# Patient Record
Sex: Male | Born: 1960 | Race: White | Marital: Married
Health system: Western US, Academic
[De-identification: ages and names within clinical notes are randomized; demographics above are authoritative.]

## PROBLEM LIST (undated history)

## (undated) DIAGNOSIS — F419 Anxiety disorder, unspecified: Secondary | ICD-10-CM

## (undated) DIAGNOSIS — E119 Type 2 diabetes mellitus without complications: Secondary | ICD-10-CM

## (undated) DIAGNOSIS — I509 Heart failure, unspecified: Secondary | ICD-10-CM

## (undated) DIAGNOSIS — I10 Essential (primary) hypertension: Secondary | ICD-10-CM

---

## 2005-05-14 ENCOUNTER — Emergency Department: Admit: 2005-05-14 | Attending: Emergency Medicine | Admitting: Emergency Medicine

## 2005-05-15 NOTE — Progress Notes (Signed)
[name], [name] LOCATION:   MR #: [id number] SEX: M AGE: 44  DATE OF SERVICE: 05/14/2005 DOB: [birth year]      EMERGENCY DEPARTMENT NOTE    LINKING LANGUAGE:    I saw this patient with Dr. Janine Ores. I confirmed the history and physical examination and developed plan of care together.    HISTORY OF PRESENT ILLNESS:     This 44 year old male presents to the Emergency Department for abdominal pain intermittently for the last one month, increased over the last several days. He had moderate amount of bright red blood per rectum. Patient states that he was seen at an outside facility several days ago and had a CT scan which showed abnormalities of his intestines but unclear if it was inflammatory bowel disease, diverticulitis, or other etiology. Positive nausea, positive vomiting, positive fatigue, positive constipation. No history of trauma.    PAST MEDICAL HISTORY:    Hypertension, diabetes. Medications: Glipizide, atenolol. ALLERGIES: VICODIN AND CODEINE. Surgical history:  for his neck.    FAMILY HISTORY: Colon cancer.    SOCIAL HISTORY: No tobacco, alcohol or drugs.    COMPLETE REVIEW OF SYSTEMS:     Positive for abdominal pain, nausea and vomiting, and bright red blood from rectum. Otherwise, complete review of systems negative.    PHYSICAL EXAMINATION:    VITAL SIGNS: Blood pressure 142/92, heart rate 69, respiratory rate 16, temperature 35.6. GENERAL: This is a middle-aged male who appears comfortable. HEENT: Sclerae anicteric. Conjunctiva pink. Oropharynx moist. NECK: Supple. CHEST: Clear to auscultation. CARDIOVASCULAR: Regular rate and rhythm. ABDOMEN: Soft, tender primarily in the left lower quadrant. No rebound. BACK: No CVA tenderness. EXTREMITIES: Unremarkable. Anoscopy fails to elicit an etiology for the bleeding. NEURO: Alert and nonfocal.    ASSESSMENT AND PLAN:    A 44 year old male with abdominal pain primarily in the left lower quadrant. He has a white count of 14.6. I am concerned of the  possibility of diverticulitis. We will CT his abdomen.        THIS WAS ELECTRONICALLY SIGNED - 05/16/2005 5:12 PM PST BY: Nena Alexander, MD  ASSOCIATE [facility]  EMERGENCY MEDICINE DEPARTMENT              AVW:[id number])    D: 05/15/2005 05:49 AM  T: 05/15/2005 05:59 AM  C#: [id number]

## 2011-04-10 ENCOUNTER — Emergency Department: Admit: 2011-04-10 | Attending: Emergency Medicine | Admitting: Emergency Medicine

## 2011-04-10 ENCOUNTER — Encounter: Payer: Self-pay | Admitting: Emergency Medicine

## 2011-04-10 DIAGNOSIS — M7989 Other specified soft tissue disorders: Secondary | ICD-10-CM | POA: Insufficient documentation

## 2011-04-10 DIAGNOSIS — R739 Hyperglycemia, unspecified: Secondary | ICD-10-CM | POA: Insufficient documentation

## 2011-04-10 HISTORY — DX: Type 2 diabetes mellitus without complications: E11.9

## 2011-04-10 HISTORY — DX: Heart failure, unspecified: I50.9

## 2011-04-10 HISTORY — DX: Anxiety disorder, unspecified: F41.9

## 2011-04-10 HISTORY — DX: Essential (primary) hypertension: I10

## 2011-04-10 MED ORDER — HYDROCODONE 5 MG-ACETAMINOPHEN 325 MG TABLET
1.0000 | ORAL_TABLET | Freq: Once | ORAL | Status: AC
Start: 2011-04-10 — End: 2011-04-10
  Administered 2011-04-10: 2 via ORAL

## 2011-04-10 MED ORDER — INSULIN HUMAN U-100 NPH-REGULR 70-30 MIX 100 UNIT/ML SUBCUTANEOUS SUSP
30.0000 [IU] | Freq: Two times a day (BID) | SUBCUTANEOUS | Status: DC
Start: 2011-04-10 — End: 2011-04-10
  Administered 2011-04-10: 30 [IU] via SUBCUTANEOUS
  Filled 2011-04-10: qty 1000

## 2011-04-10 MED ORDER — MORPHINE 4 MG/ML INJECTION SYRINGE
INJECTION | INTRAMUSCULAR | Status: AC
Start: 2011-04-10 — End: 2011-04-10
  Filled 2011-04-10: qty 1

## 2011-04-10 MED ORDER — HYDROCODONE 5 MG-ACETAMINOPHEN 325 MG TABLET
ORAL_TABLET | ORAL | Status: AC
Start: 2011-04-10 — End: 2011-04-10
  Filled 2011-04-10: qty 2

## 2011-04-10 MED ORDER — NACL 0.9% IV BOLUS - DURATION REQ
1000.0000 mL | Freq: Once | INTRAVENOUS | Status: AC
Start: 2011-04-10 — End: 2011-04-10
  Administered 2011-04-10: 1000 mL via INTRAVENOUS

## 2011-04-10 MED ORDER — MORPHINE 2 MG/ML INJECTION SYRINGE
2.0000 mg | INJECTION | Freq: Once | INTRAMUSCULAR | Status: AC
Start: 2011-04-10 — End: 2011-04-10
  Administered 2011-04-10: 4 mg via INTRAVENOUS

## 2011-04-10 MED ORDER — HYDROCODONE 5 MG-ACETAMINOPHEN 325 MG TABLET
1.0000 | ORAL_TABLET | ORAL | Status: AC | PRN
Start: 2011-04-10 — End: 2011-04-14

## 2011-04-10 NOTE — ED Nursing Note (Signed)
Pt states pain level improved.  Ambulates w/ steady gait.  Driving home.

## 2011-04-10 NOTE — ED Initial Note (Signed)
EMERGENCY DEPARTMENT PHYSICIAN NOTE - Elon Eoff       Date of Service:   04/10/2011  9:03 AM Patient's PCP: No Pcp No Pcp   Note Started: 04/10/2011 9:29 AM DOB: 04/05/1961             Chief Complaint   Patient presents with    Diabetes Evaluation       The history provided by the patient.  Interpreter used: No    Rajinder Mesick is a 50yr old male, with a past medical history significant for diabetes, who presents to the ED with a chief complaint of elevated blood sugar that began 1 week ago. Additional history is notable for poorly controlled diabetes..  Patient primarily complaining of fatigue over the past two days. No fevers. Has been using his medication. No vomiting.        A full history, including pertinent past medical, family and social history was reviewed          Review of Systems   Constitutional: Positive for malaise/fatigue.   Eyes: Positive for blurred vision.   Respiratory: Negative for cough, hemoptysis and wheezing.    Cardiovascular: Negative for chest pain.   Gastrointestinal: Negative for nausea, vomiting and diarrhea.   Genitourinary: Positive for frequency.   Neurological: Negative for headaches.   All other systems reviewed and are negative.        TRIAGE VITAL SIGNS:  Temp: 35.9 C (96.6 F) (04/10/11 0903)  Temp src: Oral (04/10/11 0903)  Pulse: 89  (04/10/11 0903)  BP: 159/74 mmHg (04/10/11 0903)  Resp: 20  (04/10/11 0903)  SpO2: 96 % (04/10/11 0903)  Weight: 142.883 kg (315 lb) (04/10/11 1610)    Physical Exam        INITIAL ASSESSMENT & PLAN, MEDICAL DECISION MAKING, ED COURSE:  Athan Casalino is a 50yr old male who presents with a chief complaint of  Hyperglycemia. After history and exam, I am most concerned for  Diabetic ketoacidosis. Differential diagnosis includes, but is not limited to  Dehydration and hyperglycemia.  The patient is hemodynamically stable and will require  Intravenous fluids as an immediate intervention. Initial treatment and studies to evaluate this problem will  include labs. The eventual disposition will depend on the results of studies and patient's clinical course.        ED Course Update: The patient was observed in the ED.  The patient's symptoms and clinical condition stabilized. The results of the ED evaluation were notable for the following:    Pertinent lab results (reviewed and interpreted independently by me):             Patient Summary:  Patient with hypoglycemia. It is  Not in diabetic ketoacidosis.  Patient was given IV fluids.  Patient concern regarding possible sleigh lightest to leg though no evidence of any cellulitis. Patient's blood sugar was reduced with IV fluids and felt immediately better. Plan will be for him to follow primary care physician      MDM COMPLEXITY  Overall Complexity of MDM is: high     LAST VITAL SIGNS:  Temp: 37 C (98.6 F) (04/10/11 1341)  Temp src: Oral (04/10/11 1341)  Pulse: 87  (04/10/11 1341)  BP: 103/54 mmHg (04/10/11 1341)  Resp: 16  (04/10/11 1341)  SpO2: 99 % (04/10/11 1341)  Weight: 142.883 kg (315 lb) (04/10/11 9604)      Disposition: Based on this diagnosis, the patient will be  discharged. Anticipate they will need further workup for this  problem, which includes  Follow-up of blood sugar.      Clinical Impression:  hyperglycemia   diabetes   dehydration      PRESENT ON ADMISSION:  Are any of the following four conditions present or suspected on admission: decubitus ulcer, infection from an intravascular device, infection due to an indwelling catheter, surgical site infection or pneumonia? No.    PATIENT'S GENERAL CONDITION:  Good: Vital signs are stable and within normal limits. Patient is conscious and comfortable. Indicators are excellent.       Report electronically signed by:  Jefferson Fuel, MD, MD Attending Physician                                     are

## 2011-04-10 NOTE — ED Triage Note (Signed)
FS 499.

## 2011-04-10 NOTE — ED Nursing Note (Signed)
Received report and assumed pt care for 1 hr lunch relief.  Pt aaox 4.  Reports moderate pain to LE's.  Repeat lactate sent per SIRS protocol-awaiting results.   Afebrile.  NAD.

## 2011-04-10 NOTE — ED Triage Note (Signed)
Pt with elevated blood sugars >600 x three weeks.  To pod C

## 2011-04-10 NOTE — ED Nursing Note (Signed)
Presents to ED w increasing weakness, feeling "not well", slurred speech ( according to sig other). Pt amb to POD C w steady gait, GCS 15, clear speech, reporting hi BG abd ongoing infection and pain to RLE. Pt stated that he finshed two week course of Levoquin and reports pain has not improved to RLE. Placed on monitor VSS, PIV placed, labs collected. Await results. COnt to monitor.

## 2011-04-10 NOTE — ED Nursing Note (Signed)
Back from rad, await results. GCS 15, VSS, NAD. Cont to monitor.

## 2014-07-01 ENCOUNTER — Emergency Department
Admission: EM | Admit: 2014-07-01 | Discharge: 2014-07-01 | Disposition: A | Discharge status: Home / Self Care | Payer: BLUE CROSS/BLUE SHIELD | Attending: Emergency Medicine | Admitting: Emergency Medicine

## 2014-07-01 ENCOUNTER — Emergency Department (EMERGENCY_DEPARTMENT_HOSPITAL): Payer: BLUE CROSS/BLUE SHIELD

## 2014-07-01 DIAGNOSIS — I639 Cerebral infarction, unspecified: Secondary | ICD-10-CM

## 2014-07-01 DIAGNOSIS — R42 Dizziness and giddiness: Secondary | ICD-10-CM | POA: Insufficient documentation

## 2014-07-01 DIAGNOSIS — E119 Type 2 diabetes mellitus without complications: Secondary | ICD-10-CM | POA: Insufficient documentation

## 2014-07-01 DIAGNOSIS — R51 Headache: Secondary | ICD-10-CM | POA: Insufficient documentation

## 2014-07-01 DIAGNOSIS — R296 Repeated falls: Secondary | ICD-10-CM | POA: Insufficient documentation

## 2014-07-01 DIAGNOSIS — I509 Heart failure, unspecified: Secondary | ICD-10-CM | POA: Insufficient documentation

## 2014-07-01 DIAGNOSIS — I10 Essential (primary) hypertension: Secondary | ICD-10-CM | POA: Insufficient documentation

## 2014-07-01 DIAGNOSIS — H81399 Other peripheral vertigo, unspecified ear: Principal | ICD-10-CM | POA: Insufficient documentation

## 2014-07-01 LAB — BASIC METABOLIC PANEL
CALCIUM: 9.2 mg/dL (ref 8.6–10.5)
CARBON DIOXIDE TOTAL: 23 meq/L — AB (ref 24–32)
CHLORIDE: 104 meq/L (ref 95–110)
CREATININE BLOOD: 0.9 mg/dL (ref 0.44–1.27)
GLUCOSE: 120 mg/dL — AB (ref 70–99)
POTASSIUM: 3.4 meq/L (ref 3.3–5.0)
SODIUM: 134 meq/L — AB (ref 135–145)
UREA NITROGEN, BLOOD (BUN): 17 mg/dL (ref 8–22)

## 2014-07-01 LAB — CBC WITH DIFFERENTIAL
BASOPHILS % AUTO: 0.5 %
BASOPHILS ABS AUTO: 0.1 10*3/uL (ref 0–0.2)
EOSINOPHIL % AUTO: 4.3 %
EOSINOPHIL ABS AUTO: 0.5 10*3/uL (ref 0–0.5)
HEMATOCRIT: 34 % — AB (ref 41–53)
HEMOGLOBIN: 10.9 g/dL — AB (ref 13.5–17.5)
LYMPHOCYTE ABS AUTO: 1.9 10*3/uL (ref 1.0–4.8)
LYMPHOCYTES % AUTO: 15.1 %
MCH: 24 pg — AB (ref 27–33)
MCHC: 31.9 % — AB (ref 32–36)
MCV: 75.2 UM3 — AB (ref 80–100)
MONOCYTES % AUTO: 9.6 %
MONOCYTES ABS AUTO: 1.2 10*3/uL — AB (ref 0.1–0.8)
MPV: 7.1 UM3 (ref 6.8–10.0)
NEUTROPHIL ABS AUTO: 8.9 10*3/uL — AB (ref 1.80–7.70)
NEUTROPHILS % AUTO: 70.5 %
PLATELET COUNT: 427 10*3/uL — AB (ref 130–400)
RDW: 16.3 U — AB (ref 0–14.7)
RED CELL COUNT: 4.53 10*6/uL (ref 4.5–5.9)
WHITE BLOOD CELL COUNT: 12.7 10*3/uL — AB (ref 4.5–11.0)

## 2014-07-01 LAB — INR: INR: 1.01 (ref 0.87–1.18)

## 2014-07-01 LAB — APTT STUDIES: aPTT: 30.9 s (ref 24.1–36.7)

## 2014-07-01 LAB — TROPONIN I: Troponin I: 0.01 ng/mL (ref 0–0.04)

## 2014-07-01 MED ORDER — ACETAMINOPHEN 325 MG TABLET
650.0000 mg | ORAL_TABLET | Freq: Once | ORAL | Status: AC
Start: 2014-07-01 — End: 2014-07-01
  Administered 2014-07-01: 650 mg via ORAL
  Filled 2014-07-01: qty 2

## 2014-07-01 MED ORDER — LORAZEPAM 2 MG/ML INJECTION SOLUTION
1.0000 mg | Freq: Once | INTRAMUSCULAR | Status: AC
Start: 2014-07-01 — End: 2014-07-01
  Administered 2014-07-01: 1 mg via INTRAVENOUS
  Filled 2014-07-01: qty 1

## 2014-07-01 NOTE — ED Nursing Note (Signed)
Pt. Given dc instructions per md with an acknowledged understanding. Pt. Transferring and ambulating independently. Dc to home.

## 2014-07-01 NOTE — ED Triage Note (Signed)
Vertigo/dizzy symptoms times one week with frequent falls.  Also reports "trouble remembering things".   Endorses headaches.   Bright flashing light, slurred speech per pt.   Left sided CP.   [facility]   [facility].   Awake and alert, skin warm and dry- slight weakness LUE.   Extensive cardiac ho.

## 2014-07-01 NOTE — ED Initial Note (Signed)
EMERGENCY DEPARTMENT PHYSICIAN NOTE - Malik [name]       Date of Service:   07/01/2014  1:50 PM Patient's PCP: Enzo Montgomery   Note Started: 07/01/2014 16:42 DOB: 1960-06-14             Chief Complaint   Patient presents with    *933:Blunt/Critical Trauma Level III     frequent falls with HA and neck pain           The history provided by the patient.  Interpreter used: No    Malik [name] is a 54yr old male, with a past medical history significant for DM, heart failure, HTN, anxiety, who presents to the ED with multiple complaints:    Gradual onset headache 1wk ago similar to past headaches, currently on topomax. Diffuse headache, not made better or worse with anything identified.     Vertigo 4 days ago, constant, non-positional, and resulted in multiple GLFs. He has never had anything similar in the past. He cannot describe anything that makes it better. Walking makes it worse.     Flashing lights in his vision but denies visual field cuts    L arm tingling and weakness, LLE weakness, L facial numbness    Intermittent L sided chest pain which is resolved on presentation.     He denies fevers, chills, SOB, abdominal pain, blood in stool or urine, facial asymmetry, seizures, syncope, back pain, palpitations.    A full history, including pertinent past medical, family and social history was reviewed.    HISTORY:  There are no hospital problems to display for this patient.   No Known Allergies   Past Medical History:    Diabetes mellitus                                             Heart failure                                                 Hypertension                                                  Anxiety                                                    No past surgical history on file.   Social History    Marital Status: MARRIED             Spouse Name:                       Years of Education:                 Number of children:               Occupational History    None on file    Social History Main  Topics    Smoking Status: Not on file  Smokeless Status: Not on file                       Alcohol Use: Not on file     Drug Use: Not on file     Sexual Activity: Not on file          Other Topics            Concern    None on file    Social History Narrative    None on file     No family history on file.             Review of Systems   Constitutional: Negative for fever and chills.   Respiratory: Negative for shortness of breath.    Cardiovascular: Positive for chest pain.   Gastrointestinal: Negative for nausea, vomiting, abdominal pain and blood in stool.   Genitourinary: Negative for dysuria and hematuria.   Musculoskeletal: Negative for back pain.   Neurological: Positive for light-headedness (mild). Negative for seizures, syncope and facial asymmetry.   All other systems reviewed and are negative.      TRIAGE VITAL SIGNS:  Temp: 36.6 C (97.9 F) (07/01/14 1258)  Temp src: Oral (07/01/14 1258)  Pulse: 83 (07/01/14 1258)  BP: 114/63 mmHg (07/01/14 1258)  Resp: 12 (07/01/14 1258)  SpO2: 99 % (07/01/14 1258)  Weight: 123.6 kg (272 lb 7.8 oz) (07/01/14 1258)    Physical Exam   Constitutional: He is oriented to person, place, and time. He appears well-developed and well-nourished. No distress.   Obese, well-appearing male seated comfortably in bed   HENT:   Head: Normocephalic and atraumatic.   Mouth/Throat: Oropharynx is clear and moist.   Eyes: EOM are normal. Pupils are equal, round, and reactive to light.   Normal visual field exam   Cardiovascular: Normal rate, regular rhythm, normal heart sounds and intact distal pulses.    No murmur heard.  Pulmonary/Chest: Effort normal and breath sounds normal. No respiratory distress. He has no wheezes. He has no rales.   Abdominal: Soft. Bowel sounds are normal. He exhibits no distension. There is no tenderness. There is no rebound and no guarding.   Musculoskeletal: Normal range of motion.   Neurological: He is alert and oriented to person, place,  and time. He has normal strength. He displays normal reflexes. No cranial nerve deficit or sensory deficit. He exhibits normal muscle tone. Coordination normal. GCS eye subscore is 4. GCS verbal subscore is 5. GCS motor subscore is 6.   Normal finger to nose, heel to shin  Strength intact in BLE and BUE  Decreased sensation in the left face and LUE   Skin: Skin is warm and dry. He is not diaphoretic.   Psychiatric: He has a normal mood and affect. His behavior is normal.   Nursing note and vitals reviewed.      INITIAL ASSESSMENT & PLAN, MEDICAL DECISION MAKING, ED COURSE:  Malik [name] is a [age]yr old male who presents with multiple complaints including headache, vertigo, visual disturbance, extremity and facial numbness, and intermittent L sided chest pain that is resolved.. This patient's differential diagnosis is broad and complex and includes but is not limited to acute CVA, TIA, complex migraine headache, seizure, focal neurologic palsy, electrolyte disorder, intracranial injury, subarachnoid hemorrhage, expanding intracranial aneurysm, intracranial mass.  Meningitis is unlikely, given the onset and stability of symptoms, the lack of systemic symptoms, and the lack of fever.  ED Course Update: The patient was observed in the ED. The results of the ED evaluation were notable for the following:    Pertinent lab results (reviewed and interpreted independently by me):   Labs Reviewed   CBC WITH DIFFERENTIAL - Abnormal; Notable for the following:     WHITE BLOOD CELL COUNT 12.7 (*)     HEMOGLOBIN 10.9 (*)     HEMATOCRIT 34.0 (*)     MCV 75.2 (*)     MCH 24.0 (*)     MCHC 31.9 (*)     RDW 16.3 (*)     PLATELET COUNT 427 (*)     NEUTROPHIL ABS AUTO 8.90 (*)     MONOCYTES ABS AUTO 1.2 (*)     All other components within normal limits    Narrative:     CHANGED TO STAT REC'D THROUGH TUBE STATION   BASIC METABOLIC PANEL - Abnormal; Notable for the following:     SODIUM 134 (*)     CARBON DIOXIDE TOTAL 23 (*)      GLUCOSE 120 (*)     All other components within normal limits    Narrative:     CHANGED TO STAT REC'D THROUGH TUBE STATION   INR    Narrative:     CHANGED TO STAT REC'D THROUGH TUBE STATION   APTT STUDIES    Narrative:     CHANGED TO STAT REC'D THROUGH TUBE STATION   TROPONIN I   URINALYSIS-COMPLETE   UR DRUGS OF ABUSE SCREEN        Pertinent imaging results (reviewed and interpreted independently by me):     MR BRAIN + MR ANGIO BRAIN + MR ANGIO NECK   MRI BRAIN:  1. NO ACUTE INTRACRANIAL FINDINGS. NO EVIDENCE OF ACUTE INFARCT.  2. MILD CEREBRAL VOLUME LOSS.    MRA CIRCLE OF WILLIS:  NO HIGH-GRADE STENOSIS, OCCLUSION, OR ANEURYSM WITHIN THE CIRCLE OF WILLIS.    MRA NECK:  NO HIGH-GRADE STENOSIS OR OCCLUSION WITHIN THE VASCULATURE OF THE NECK.      Interventions/Pertinent medications:   Tylenol  PO  Ativan  IV        Patient Summary:    Malik [name] is a 54yr old male presenting with multiple complaints but primary complaint of vertigo. No evidence of traumatic injury despite multiple reported GLF over the previous week. Denies SOB or CP on presentation. MRI of head and neck negative. Favor peripheral cause of vertigo. CURES report reveals multiple controlled medication prescriptions over a short period, directed to primary physician for pain control and advised that he should only be receiving controlled medications from a single physician. Patient reported symptomatic improvement while in the ED. +ambulation.     Discharge information:  Patient is advised they should follow up with primary care physician in 1-2 days.  If unable to obtain follow up they may always return to ED .  They are also advised to return to ED immediately with any worsening symptoms or signs.  Patient understands discharge instructions and comfortable with discharge home. Patient was also given written discharge instructions and acknowledges receipt of discharge instructions.   Patient is also advised that imaging studies were  preliminary studies that does not exclude occult findings.  Patient is also advised that they may be called back to ED with any changes in final radiological readings        LAST VITAL SIGNS:  Temp: 36.6 C (97.9 F) (07/01/14 1258)  Temp src: Oral (07/01/14  1258)  Pulse: 70 (07/01/14 1549)  BP: 115/68 mmHg (07/01/14 1549)  Resp: 18 (07/01/14 1549)  SpO2: 99 % (07/01/14 1549)  Weight: 123.6 kg (272 lb 7.8 oz) (07/01/14 1258)      Clinical Impression:    Peripheral Vertigo         Disposition: Discharge. Follow up with PMD. ED discharge instructions were reviewed and provided. It was discussed that radiology reports are preliminary and the patient will be contacted for any changes in interpretation.        MDM COMPLEXITY  Overall Complexity of MDM is: high          PRESENT ON ADMISSION:  Are any of the following four conditions present or suspected on admission: decubitus ulcer, infection from an intravascular device, infection due to an indwelling catheter, surgical site infection or pneumonia? No.    PATIENT'S GENERAL CONDITION:  Good: Vital signs are stable and within normal limits. Patient is conscious and comfortable. Indicators are excellent.       DISCLAIMER:   This document serves as my personal record of services taken in my presence. It was created on 07/01/2014 on my behalf by Billy Coast, a trained medical scribe.     I have reviewed this document and agree that this note accurately reflects the history and exam findings, the patient care provided, and my medical decision making.    This patient was seen, evaluated, and the care plan was developed in conjunction with Elyn Aquas, MD  - Resident Physician. I agree with the findings and plan as outlined in our combined note.      07/01/2014  Electronically Signed By: Jefferson Fuel, MD   Attending Physician, Department of Emergency Medicine  [facility]

## 2014-07-01 NOTE — Progress Notes (Addendum)
Patient was coded as a 933 Trauma. Upon further discussion with an EM attending, we discovered that he was instead a rule out CVA and we were told he had no trauma criteria as he had not taken Plavix for one week.   We continued to evaluate the patient.    933 trauma code activation   Patient seen and evaluated by the trauma service within one hour of presentation. We will follow the patient's ED course closely.  When appropriate, we will be ready to admit the patient. Thank you.   Junie Spencericole Nasser, MSN, AGACNP-BC  [place] [phone]  07/01/2014  14:34

## 2014-07-01 NOTE — ED Progress Note (Signed)
Emergency Department Triage Note     Subjective: Tamsen SniderMark Crocket is a [age]yr old male who presents to the Emergency Department (ED) with a chief complaint of dizziness.      Phone: [phone]    Objective (pertinent exam findings):    BP 114/63 mmHg  Pulse 83  Temp(Src) 36.6 C (97.9 F) (Oral)  Resp 12  Ht 1.753 m (5\' 9" )  Wt 123.6 kg (272 lb 7.8 oz)  BMI 40.22 kg/m2  SpO2 99%     General appearance: No acute distress.    Assessment: Tamsen SniderMark Hizer is a [age]yr old male with dizziness.      Plan: Initial studies to evaluate this problem may include labs, imaging, and/or EKG. Further workup will then proceed in the ED after the patient is brought in from the Waiting Room or ED Hallway.    Seen and initially evaluated by: Mosetta PuttJohn Boysie Bonebrake, MD, Attending Physician, Department of Emergency Medicine     NOTE: This patient was seen by a Physician-in-Triage. Please see the ED Initial Note for full details of this patient's care. This note covers the brief initial evaluation of the patient's general appearance, performed to obtain initial labs, imaging, and/or EKG to expedite the patient's care. It is not intended to be a comprehensive document of the patient's ED visit.    Additionally, this patient and/or their guardian was informed that this preliminary assessment and any lab, imaging, and EKG studies obtained do not constitute a complete workup. The patient and/or their guardian was clearly instructed to remain in the Waiting Room or ED Hallway to be called into the ED and be fully examined and evaluated by an ED physician whether or not their initial studies are completed.

## 2014-07-01 NOTE — Discharge Instructions (Signed)
Thank you for choosing [facility] for your emergency health care needs. It has been our privilege to take care of you today. Your primary complaints have been evaluated based on your history, lab tests, imaging tests and you have been treated for your symptoms and discharged home. Please take all medicines that are prescribed to you as directed (see below). It is crucial, if you have a primary care physician, to follow up with him or her in the time frame recommended as many health conditions that seem self-limited initially may actually worsen over time. If you do not have a primary care physician, we will outline the various resources available for you to find one.    If at any time you feel that your condition is worsening, call your doctor or return to the Emergency Department for reevaluation. CALL 911 IF YOU THINK YOU ARE HAVING A MEDICAL EMERGENCY. Return to the Emergency Department if you are unable to obtain the recommended follow-up treatment or you are not better as expected. You can call the Medical Center with questions at [phone].    Please realize that the results of some studies that you had done during your stay with us (such as x-rays and cultures) have only preliminarily results at this time. Results of these studies may change as more information becomes available or as the studies are re-evaluated by other members of our health care team in the next few days. We will attempt to contact you with any important changes or additions to the studies that were obtained today, particularly if any of these results require a change in your treatment.    GENERAL PAIN MEDICATION PRECAUTIONS    You may have been prescribed medications for pain today. Some of these may contain a narcotic (Vicodin, Norco, Percocet, etc.) Take these pain medications as prescribed. You also may have been prescribed a muscle relaxant or anxiolytic (benzodiazepine) such as valium (diazepam) or ativan  (lorazepam). Do not drink alcohol, drive, or operate heavy machinery while taking either narcotic or benzodiazepine medications. All narcotics and benzodiazepines have a risk of dependence, so please use with caution. If you were prescribed Vicodin, Norco, or Percocet, do not take additional products containing Tylenol (acetaminophen), as this can cause an acetaminophen overdose and can damage your liver. Do not take more than 4000mg of acetaminophen daily.    You may also have been prescribed an anti-inflammatory pain medication (NSAID) such as ibuprofen (Motrin, Advil) or naproxen (Aleve). Take the NSAID as prescribed, with food or milk. Stop taking the NSAID if you develop abdominal pain, vomiting blood or dark/tarry or bloody stools. Be sure to drink plenty of fluids, at least 1-2 liters of water per day while taking an NSAID unless you have congestive heart failure or other condition that requires you to limit your water intake.      (Last updated 05/28/10)

## 2014-07-01 NOTE — ED Nursing Note (Signed)
Assumed care. Pt reports ongoing dizziness, weakness, head and neck pain for "several days" reports generalized weakness and multiple falls. Denies a hx of these symptoms, hx of tia, cardiac stents, diabetes. Vss. Continue to assess and monitor.

## 2014-07-03 LAB — ELECTROCARDIOGRAM WITH RHYTHM STRIP: QTC: 439

## 2017-11-18 LAB — WBC AUTO DIFF (OUTSIDE ORGANIZATION)
Basophils % Auto: 0 % — NL (ref 0–1)
Eosinophils % Auto: 3 % — NL (ref 0–7)
Immature Granulocytes % Auto: 0 % — NL (ref 0–1)
Lymphocytes % Auto: 18 % — NL (ref 15–47)
Monocytes % Auto: 9 % — NL (ref 5–13)
Neutrophils % Auto: 68 % — NL (ref 42–76)
Neutrophils, Automated Count: 8.8 10*3/uL — ABNORMAL HIGH (ref 1.8–7.9)

## 2017-11-18 LAB — CHEM 7 (NA, K, CL, CO2, BUN, GLUC, CR) (OUTSIDE ORGANIZATION)
Anion Gap: 7 meq/L — NL (ref 5–16)
CO2: 25 meq/L — NL (ref 24–33)
Chloride: 99 meq/L — ABNORMAL LOW (ref 100–111)
Glucose: 394 mg/dL — ABNORMAL HIGH (ref 60–159)
Potassium: 4.6 meq/L — NL (ref 3.5–5.3)
Sodium: 131 meq/L — ABNORMAL LOW (ref 135–145)
Urea Nitrogen, Blood (BUN): 20 mg/dL — NL (ref 7–27)

## 2017-11-18 LAB — ALKALINE PHOSPHATASE (ALP): Alkaline Phosphatase (ALP): 154 U/L — ABNORMAL HIGH (ref 37–117)

## 2017-11-18 LAB — ALANINE TRANSFERASE (ALT): Alanine Transferase (ALT): 13 U/L — NL (ref 0–47)

## 2017-11-18 LAB — ASPARTATE TRANSAMINASE (AST): Aspartate Transaminase (AST): 11 U/L — NL (ref 10–40)

## 2017-11-18 LAB — CBC WITH DIFFERENTIAL
Hematocrit: 37.2 % — ABNORMAL LOW (ref 39.0–51.0)
Hemoglobin: 12.2 g/dL — ABNORMAL LOW (ref 13.0–17.0)
MCV: 76 fL — ABNORMAL LOW (ref 80–100)
Platelets count: 344 10*3/uL — NL (ref 140–400)
RDW, RBC: 16 % — NL (ref 12.0–16.5)
Red blood cells count: 4.9 M/uL — NL (ref 4.10–5.70)
WBC Count: 12.9 10*3/uL — ABNORMAL HIGH (ref 3.7–11.1)

## 2017-11-18 LAB — URINALYSIS W/MICROSCOPIC
RBC, urine: 2 /[HPF] — NL (ref 0–3)
Squamous cells count, ur sed, Qn: 8 /[LPF] — ABNORMAL HIGH (ref 0–2)
WBC, urine: <1 /[HPF] — NL (ref 0–5)

## 2017-11-18 LAB — BILIRUBIN TOTAL: Bilirubin Total: 0.2 mg/dL — NL (ref 0.2–1.2)

## 2018-02-02 IMAGING — CR CHEST
2 series · 2 of 2 positions shown · non-contrast
Comparison: none

[chest pa x-wise]
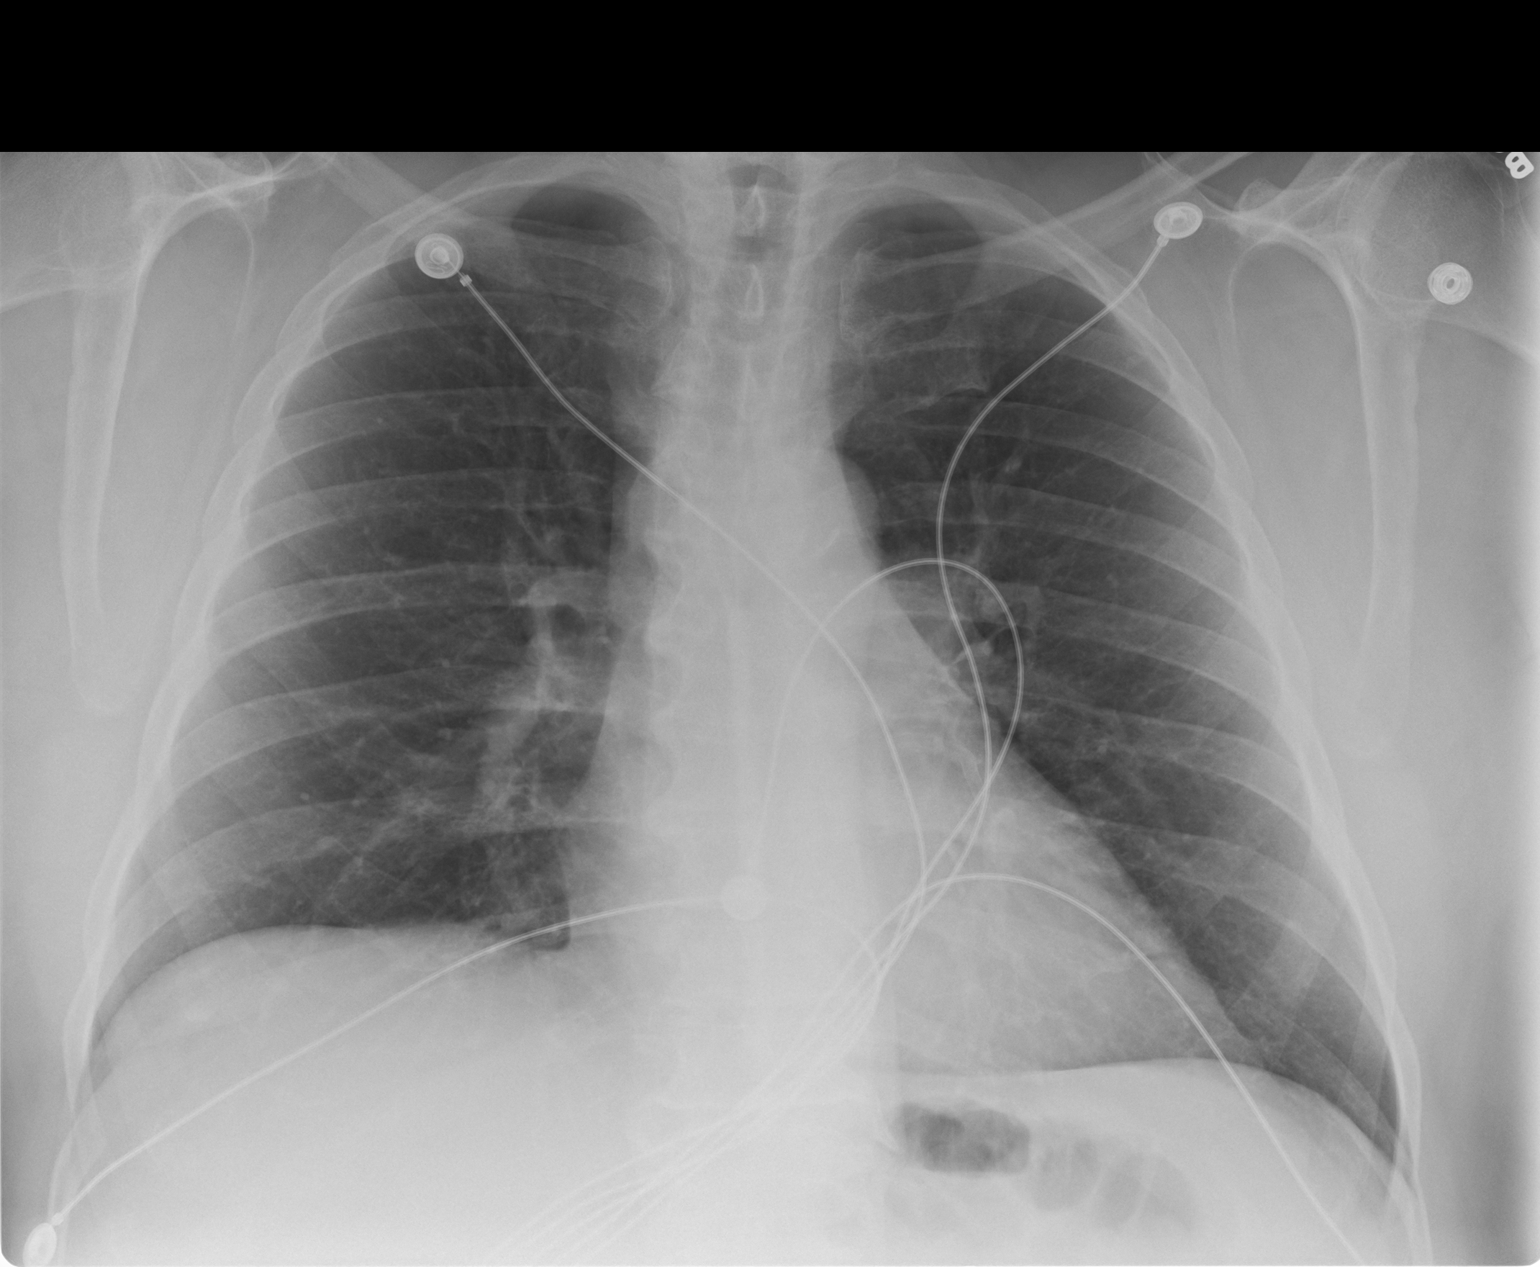

[chest lat]
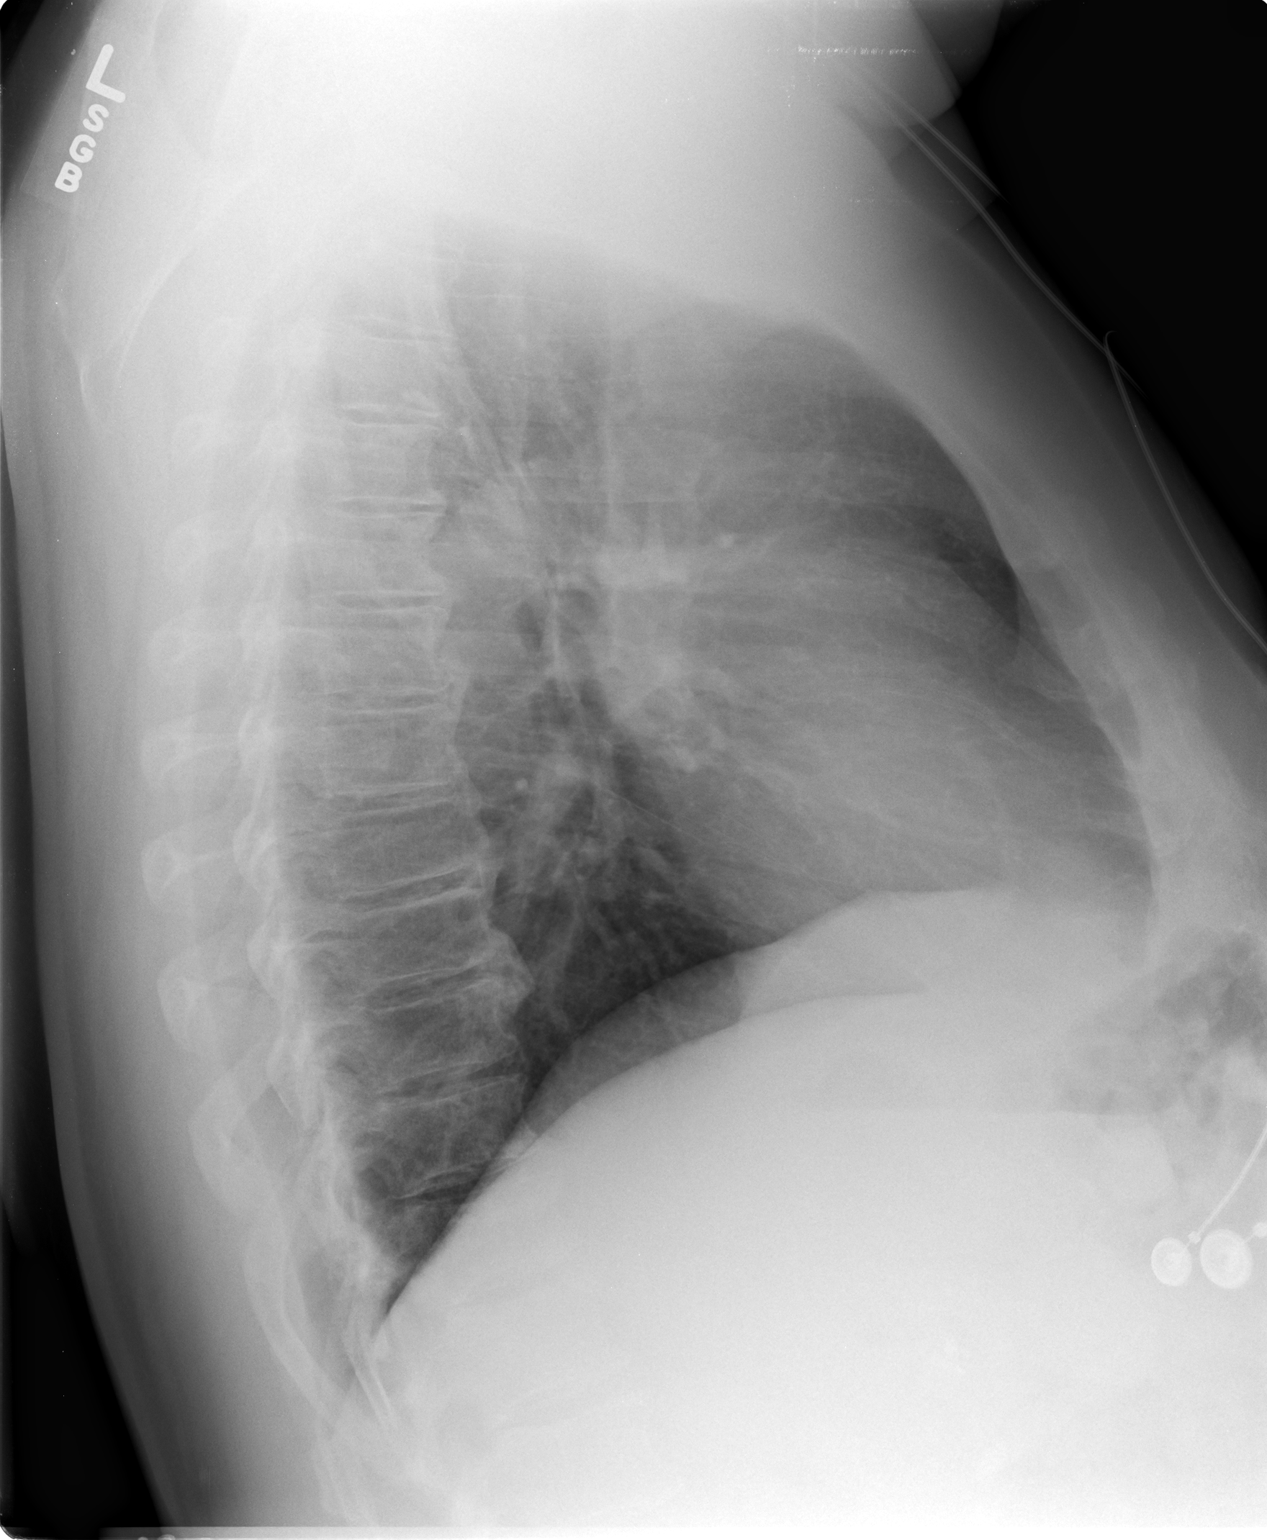

[2 of 2 positions shown; findings below may reference images not displayed]

DIAGNOSTIC STUDIES

EXAM

INDICATION
fall pain
PT FELL EARLIER TODAY AND IS EXPERIENCING CHEST PAIN. MEDS FOR
HYPERTENSION. SB

TECHNIQUE
Two views.

COMPARISONS
The compared

FINDINGS
The upper abdomen is normal. The heart size is normal. There are chronic lung changes. There are
bony degenerative changes. There is no focal lung consolidation. There is no pneumothorax

IMPRESSION
Chronic lung changes without focal lung consolidation

## 2019-07-21 ENCOUNTER — Ambulatory Visit: Payer: BLUE CROSS/BLUE SHIELD | Admitting: RHEUMATOLOGY

## 2019-07-28 ENCOUNTER — Ambulatory Visit: Payer: BLUE CROSS/BLUE SHIELD | Admitting: RHEUMATOLOGY

## 2019-07-28 VITALS — BP 114/60 | HR 75 | Wt 278.9 lb

## 2019-07-28 DIAGNOSIS — M316 Other giant cell arteritis: Secondary | ICD-10-CM | POA: Insufficient documentation

## 2019-07-28 DIAGNOSIS — Z6841 Body Mass Index (BMI) 40.0 and over, adult: Secondary | ICD-10-CM

## 2019-07-28 DIAGNOSIS — Z79899 Other long term (current) drug therapy: Secondary | ICD-10-CM

## 2019-07-28 NOTE — Nursing Note (Signed)
Vital signs obtained, chief complaint identified, allergies verified, screened for pain, med history taken, pharmacy verified.  Jasir Rother, MA I

## 2019-07-28 NOTE — Progress Notes (Signed)
Rheumatology Initial Consultation Note    Reason for consult : per Referring physician : Giant cell arteritis    Dear Dr. Velia Meyer, MD   I had the pleasure of seeing Malik [name] in consultation in my rheumatology clinic    CC : Giant cell arteritis    HPI : Malik [name], 59yrold male presents to Rheumatology clinic for initial evaluation.   Patient is a very pleasant 59year old Caucasian male who reports that he has longstanding history of uncontrolled diabetes, history of chronic lower back pain for which she has had surgery previously 2017-2018 and has been taking pain medications for long period of time.  Patient reports that he was at his baseline until September 20 years when he started having acute onset of headache felt like somebody hit him in the back of the head with a baseball back, he ended up going to the hospital where he was ruled out from stroke he was found to have elevated ESR at about 120 elevated CRP level was given intravenous Solu-Medrol 1000 mg for 3 days, left temporal artery biopsy was done was was negative.  He reports that his symptoms may improve somewhat but because of his steroids his glucose levels went above 500 therefore refused to take anymore prednisone.  He reports the pain is around his temporal area he is having hard time even touching his scalp also ports of some pain in the left jaw when he is chewing.  He also has increased symptoms in his right side over the past few months.  He has not had any vision problems he did saw see his ophthalmology recently did not see any evidence of any optic nerve damage.  No pain in the tongue.  Patient reports the pain is stabbing and throbbing it is every day.  He did go to rheumatologist who diagnosed him in temporal arteritis and try to start him on Actemra injections but it was not covered by the insurance therefore he did not take the injections.    Patient denies any pain in his shoulders elbows hands hips knees feet no  rashes no history of uveitis patient has not seen neurologist no oronasal secretions no blood in urine no blood in stools no weight loss good appetite does not smoke does not drink alcohol or diabetes although has been uncontrolled over the past 30 years with A1c's ranging around 9-11.    Impact on activities of daily living: severe     Review of Systems   All other systems reviewed and are negative.      I reviewed patient's past medical/surgical and family/social history  PAST MEDICAL HX:  Past Medical History:   Diagnosis Date    Anxiety     Diabetes mellitus     Heart failure     Hypertension        PAST SURGICAL HX:  No past surgical history on file.    MEDICATIONS: reviewed in EMR  Current Outpatient Medications on File Prior to Visit   Medication Sig Dispense Refill    Carvedilol (COREG) 25 mg Tablet        Citalopram (CELEXA) 10 mg tablet        FUROsemide (LASIX) 20 mg Tablet        INSULIN NPH S-S/REG INSULN S-S (INSULIN NPH-REGULAR HUM S-SYN SC)         No current facility-administered medications on file prior to visit.         Immunization:  There is no immunization history on file for this patient.    ALLERGY:  No Known Allergies     SOCIAL HX:  Social History     Socioeconomic History    Marital status: MARRIED     Spouse name: Not on file    Number of children: Not on file    Years of education: Not on file    Highest education level: Not on file   Occupational History    Not on file   Tobacco Use    Smoking status: Not on file   Substance and Sexual Activity    Alcohol use: Not on file    Drug use: Not on file    Sexual activity: Not on file   Other Topics Concern    Not on file   Social History Narrative    Not on file     Social Determinants of Health     Financial Resource Strain:     Difficulty of Paying Living Expenses: Not on file   Food Insecurity:     Worried About [place] in the Last Year: Not on file    Ran Out of Food in the Last Year: Not on file    Transportation Needs:     Lack of Transportation (Medical): Not on file    Lack of Transportation (Non-Medical): Not on file   Physical Activity:     Days of Exercise per Week: Not on file    Minutes of Exercise per Session: Not on file   Stress:     Feeling of Stress : Not on file   Social Connections:     Frequency of Communication with Friends and Family: Not on file    Frequency of Social Gatherings with Friends and Family: Not on file    Attends Religious Services: Not on file    Active Member of Clubs or Organizations: Not on file    Attends Archivist Meetings: Not on file    Marital Status: Not on file   Intimate Partner Violence:     Fear of Current or Ex-Partner: Not on file    Emotionally Abused: Not on file    Physically Abused: Not on file    Sexually Abused: Not on file       FAMILY HX:  No family history on file.        OBJECTIVE: BP 114/60 (SITE: left arm, Orthostatic Position: sitting, Cuff Size: large)   Pulse 75   Wt 126.5 kg (278 lb 14.1 oz)   SpO2 96%   BMI 41.18 kg/m      Physical Exam  Vitals reviewed.   Constitutional:       Appearance: Normal appearance. He is obese.   HENT:      Head: Normocephalic and atraumatic.      Comments: Pain on palpation around the temporal areas as well as parietal areas bilaterally with some swelling around the left temporal artery noted good pulsations noted.     Right Ear: Tympanic membrane normal.      Left Ear: Tympanic membrane normal.      Nose: Nose normal.      Mouth/Throat:      Mouth: Mucous membranes are moist.      Pharynx: Oropharynx is clear.   Eyes:      Extraocular Movements: Extraocular movements intact.      Conjunctiva/sclera: Conjunctivae normal.      Pupils: Pupils are equal, round, and reactive to  light.   Cardiovascular:      Rate and Rhythm: Normal rate and regular rhythm.      Pulses: Normal pulses.      Heart sounds: Normal heart sounds.   Pulmonary:      Effort: Pulmonary effort is normal.      Breath  sounds: Normal breath sounds.   Abdominal:      General: Abdomen is flat. Bowel sounds are normal.      Palpations: Abdomen is soft.   Musculoskeletal:         General: Normal range of motion.      Cervical back: Normal range of motion and neck supple.   Skin:     General: Skin is warm and dry.      Capillary Refill: Capillary refill takes less than 2 seconds.   Neurological:      General: No focal deficit present.      Mental Status: He is alert and oriented to person, place, and time. Mental status is at baseline.   Psychiatric:         Mood and Affect: Mood normal.         Behavior: Behavior normal.         Thought Content: Thought content normal.         Judgment: Judgment normal.           LABS:  Lab Results   Lab Name Value Date/Time    WBC 12.7 (H) 07/01/2014 02:35 PM    HGB 10.9 (L) 07/01/2014 02:35 PM    HCT 34.0 (L) 07/01/2014 02:35 PM    PLT 427 (H) 07/01/2014 02:35 PM       Lab Results   Lab Name Value Date/Time    NA 134 (L) 07/01/2014 02:35 PM    K 3.4 07/01/2014 02:35 PM    CL 104 07/01/2014 02:35 PM    CO2 23 (L) 07/01/2014 02:35 PM    BUN 17 07/01/2014 02:35 PM    CR 0.90 07/01/2014 02:35 PM    GLU 120 (H) 07/01/2014 02:35 PM     Lab Results   Lab Name Value Date/Time    AST 31 04/10/2011 09:27 AM    ALT 26 04/10/2011 09:27 AM    ALP 113 04/10/2011 09:27 AM    ALB 3.2 (L) 04/10/2011 09:27 AM    TP 7.2 04/10/2011 09:27 AM    TBIL 0.4 04/10/2011 09:27 AM     No results found for: ANASCREEN, ANATITER, ANAPATTERN, ANACOMMENT, ANAPATTERN2    Result Date: 07/23/2019  CT HEAD WITHOUT CONTRAST HISTORY : 59 years old, Head Trauma images of the head acquired without intravenous contrast. CTDI: 50.64 mGy DLP: 856.9 mGy-cm COMPARISON: CTs 11/19/2016, 3/29/2018BRAIN PARENCHYMA: No acute hemorrhage. No mass effect or herniation. Gray-white differentiation is maintained. White matter is within normal limits for age. VENTRICLES/EXTRA-AXIAL SPACES: No hydrocephalus or extra-axial fluid collections. EXTRACRANIAL  STRUCTURES: Metallic clips are now seen within the subcutaneous tissues of the right preauricular region. Correlate clinically. The calvarium is intact. Visualized paranasal sinuses and mastoid air cells are clear.   ESR, Westergren < OR = 20 mm/h 110High       Information displayed in this report will not trend and will not trigger automated decision support.    Ref Range & Units 3 mo ago   CRP <8.0 mg/L 22.4High     Resulting Agency  [facility]   Narrative  Performed by [facility]  FASTING:NO   FASTING: NO  Specimen  Collected: 04/29/19 13:11 Last Resulted: 04/30/19 13:43   Received From: [facility]  Result Received: 07/10/19 12:46           Pertinent laboratory data  and imagingon EMR was reviewed and discussed with patient.    ASSESSMENT/PLAN: Malik [name] is a 59yrold male who presents to rheumatology clinic for initial evaluation.    1. Temporal arteritis syndrome ([facility]  Patient very pleasant 59year old Caucasian male with uncontrolled diabetes with classic signs and symptoms of temporal arteritis with markedly elevated ESR and CRP levels, with glucose levels going above 600 when he takes even prednisone 10 mg.  Patient will be started on Actemra infusions 4 mg/kg every 4 weeks.  If no improvement he will increase his Actemra to 8 mg/kg.      When used in combination with DMARDs or as monotherapy the recommended starting dose is 4 mg per kg every 4 weeks followed by an increase to 8 mg per kg every 4 weeks based on clinical response.  Or     Repeat complete blood count and complete metabolic count and lipid profile at 4 weeks, 8 weeks and then every 12 weeks.  Discussed following side effects with the patient and to notify me asap if any happen:  Tocilizumab can lower the ability of your immune system to fight infections. Allergic reactions to intravenous tocilizumab infusions can occur, which can include symptoms such as fever and chills, but these are rare.  Tocilizumab has been associated with increased cholesterol levels in some patients and will be periodically monitored. A rare complication seen with tocilizumab use in clinical trials was bowel perforation, or a hole in the bowel wall. If you have a history or diverticulitis or develop abdominal pain or bloody bowel movements while taking tocilizumab, you should notify me immediately.    Call me:  If any symptoms of an infection, such as a fever or cough.  If you are pregnant or considering pregnancy.  Before receiving any vaccines or undergoing any surgeries while taking this medication.  If you develop any abdominal pain,blood in stools or diarrhea.        - Hepatitis B Core Ab, Total; Future  - Hepatitis B Surface Antibody; Future  - Hepatitis B Surface Antigen; Future  - Hepatitis C Ab Screen; Future  - QuantiFERON,TB Gold Plus; Future  - C-Reactive Protein; Future  - Sed Rate Westergren; Future  - CBC with Differential; Future  - Comprehensive Metabolic Panel; Future  - CBC with Differential; Future  - Comprehensive Metabolic Panel; Future  - Lipid Panel; Future  - CBC with Differential; Future  - Comprehensive Metabolic Panel; Future  - Lipid Panel; Future  - CBC with Differential; Future  - Comprehensive Metabolic Panel; Future  - Lipid Panel; Future  - CBC with Differential; Future  - Comprehensive Metabolic Panel; Future  - Lipid Panel; Future  - CBC with Differential; Future  - Comprehensive Metabolic Panel; Future  - Lipid Panel; Future      A total of 45 minutes were spent with the patient, more than 50 % of the face-to-face time with patient was spent discussing the patient's disease status, potential diagnosis,prognosis, treatment options, side effects, and treatment plan.   No barriers to learning. Patient verbalizes understanding of teaching and instructions.  Patient reminded to always contact me should any concerns develop.        I appreciate being able to assist in this patient's care.    RTC :  4  weeks      Report electronically signed by:  Frank Pilger MD  Division of Rheumatology, Allergy and Immunology

## 2019-07-29 DIAGNOSIS — Z79899 Other long term (current) drug therapy: Secondary | ICD-10-CM | POA: Insufficient documentation

## 2019-07-30 ENCOUNTER — Telehealth: Payer: Self-pay | Admitting: RHEUMATOLOGY

## 2019-07-30 NOTE — Telephone Encounter (Signed)
Malik [name] from patients PCP [facility], requesting office visit notes from 3/2 to be faxed to [phone].      Malik [name]  [facility] Patient Contact Center  [phone]

## 2019-07-30 NOTE — Telephone Encounter (Signed)
Lab results faxed to [facility] at Fax number below.  Marcile Fuquay, MA I 07/30/19 5:22 PM

## 2019-07-30 NOTE — Telephone Encounter (Signed)
Patient calling in regards to lab work placed by Dr Steva Ready. Patient is requesting lab orders to be sent to [facility] [address], [place], [place] [address] Fax #[phone]. Please contact patient once orders are sent. Thank you

## 2019-07-31 ENCOUNTER — Telehealth: Payer: Self-pay | Admitting: RHEUMATOLOGY

## 2019-07-31 NOTE — Telephone Encounter (Signed)
Clinical notes faxed to [facility].  Parnika Tweten, MA I 07/31/19 10:17 AM

## 2019-07-31 NOTE — Telephone Encounter (Signed)
Recieved an auth to schedule Actemra for pt. Outgoing call to pt. Scheduled and confrimed the below appt dates:    3/11 @ [address] [facility] infusion   4/8 @ [address] [facility] infusion     Thank you,    Gena Fray  [facility]  Adult Infusion Center  Ext [phone] opt 3

## 2019-08-04 ENCOUNTER — Telehealth: Payer: Self-pay | Admitting: RHEUMATOLOGY

## 2019-08-04 NOTE — Telephone Encounter (Signed)
General Advice / Message to MD:    Requesting advice about headache/back pain.   The problem started 6 month(s) ago, and is increasing.  He has tried Hydrocodone  He requests advice.

## 2019-08-05 MED ORDER — TOCILIZUMAB 400 MG/20 ML (20 MG/ML) INTRAVENOUS SOLUTION
480.0000 mg | Freq: Once | INTRAVENOUS | Status: AC
Start: 2019-08-06 — End: 2019-08-06
  Administered 2019-08-06: 09:00:00 480 mg via INTRAVENOUS
  Filled 2019-08-05: qty 20

## 2019-08-05 NOTE — Telephone Encounter (Signed)
Infusions approved and routed to adult infusion center.  Kaleb Linquist, MA I 08/05/19 8:44 AM

## 2019-08-05 NOTE — Telephone Encounter (Signed)
Patient called in requesting if there is anything else he can do in the mean time?  Patient has an apt tomorrow for the infusion.   Patient is requesting a call back.      Winferd Humphrey  [facility] Patient Contact Center  [phone]

## 2019-08-06 ENCOUNTER — Encounter: Payer: Self-pay | Admitting: RHEUMATOLOGY

## 2019-08-06 ENCOUNTER — Ambulatory Visit
Admission: RE | Admit: 2019-08-06 | Discharge: 2019-08-06 | Disposition: A | Discharge status: Home / Self Care | Payer: BLUE CROSS/BLUE SHIELD | Source: Ambulatory Visit | Attending: RHEUMATOLOGY | Admitting: RHEUMATOLOGY

## 2019-08-06 VITALS — BP 156/75 | HR 82 | Temp 98.2°F | Resp 16 | Wt 278.9 lb

## 2019-08-06 DIAGNOSIS — M545 Low back pain: Secondary | ICD-10-CM | POA: Insufficient documentation

## 2019-08-06 DIAGNOSIS — R079 Chest pain, unspecified: Secondary | ICD-10-CM | POA: Insufficient documentation

## 2019-08-06 DIAGNOSIS — T887XXA Unspecified adverse effect of drug or medicament, initial encounter: Secondary | ICD-10-CM | POA: Insufficient documentation

## 2019-08-06 DIAGNOSIS — R519 Headache, unspecified: Secondary | ICD-10-CM | POA: Insufficient documentation

## 2019-08-06 DIAGNOSIS — T394X5A Adverse effect of antirheumatics, not elsewhere classified, initial encounter: Secondary | ICD-10-CM | POA: Insufficient documentation

## 2019-08-06 DIAGNOSIS — M316 Other giant cell arteritis: Secondary | ICD-10-CM | POA: Insufficient documentation

## 2019-08-06 DIAGNOSIS — R0602 Shortness of breath: Secondary | ICD-10-CM | POA: Insufficient documentation

## 2019-08-06 MED ORDER — EPINEPHRINE 0.3 MG/0.3 ML INJECTION, AUTO-INJECTOR
0.3000 mg | AUTO-INJECTOR | INTRAMUSCULAR | Status: DC | PRN
Start: 2019-08-06 — End: 2019-08-06

## 2019-08-06 MED ORDER — HYDROMORPHONE 2 MG/ML INJECTION SOLUTION
1.0000 mg | Freq: Once | INTRAMUSCULAR | Status: AC
Start: 2019-08-06 — End: 2019-08-06
  Administered 2019-08-06: 11:00:00 1 mg via INTRAVENOUS
  Filled 2019-08-06: qty 1

## 2019-08-06 MED ORDER — ALTEPLASE 2 MG INTRA-CATHETER SOLUTION
2.0000 mg | Status: DC | PRN
Start: 2019-08-06 — End: 2019-08-06

## 2019-08-06 MED ORDER — DIPHENHYDRAMINE 50 MG/ML INJECTION SOLUTION
25.0000 mg | INTRAMUSCULAR | Status: AC | PRN
Start: 2019-08-06 — End: 2019-08-06
  Administered 2019-08-06: 50 mg via INTRAVENOUS

## 2019-08-06 MED ORDER — ALBUTEROL SULFATE 2.5 MG/3 ML (0.083 %) SOLUTION FOR NEBULIZATION
2.5000 mg | INHALATION_SOLUTION | RESPIRATORY_TRACT | Status: DC | PRN
Start: 2019-08-06 — End: 2019-08-06

## 2019-08-06 MED ORDER — FAMOTIDINE (PF) 20 MG/50 ML IN 0.9 % NACL (ISO) INTRAVENOUS PIGGYBACK
20.0000 mg | INJECTION | INTRAVENOUS | Status: AC | PRN
Start: 2019-08-06 — End: 2019-08-06
  Administered 2019-08-06: 10:00:00 20 mg via INTRAVENOUS

## 2019-08-06 MED ORDER — NACL 0.9% IV INFUSION
INTRAVENOUS | Status: DC
Start: 2019-08-06 — End: 2019-08-06

## 2019-08-06 MED ORDER — METHYLPREDNISOLONE SODIUM SUCCINATE 125 MG SOLUTION FOR INJECTION
125.0000 mg | INTRAMUSCULAR | Status: AC | PRN
Start: 2019-08-06 — End: 2019-08-06
  Administered 2019-08-06: 125 mg via INTRAVENOUS

## 2019-08-06 MED ORDER — HEPARIN, PORCINE (PF) 100 UNIT/ML INTRAVENOUS SYRINGE
3.0000 mL | INJECTION | INTRAVENOUS | Status: DC | PRN
Start: 2019-08-06 — End: 2019-08-06

## 2019-08-06 MED ORDER — HYDROMORPHONE 2 MG/ML INJECTION SOLUTION
0.5000 mg | Freq: Once | INTRAMUSCULAR | Status: DC
Start: 2019-08-06 — End: 2019-08-06

## 2019-08-06 NOTE — Progress Notes (Signed)
Adult Infusion Center  [facility]  Nurse Practitioner Note    ID: Malik [name] is a 59 y/o male with temporal arteritis, here at infusion center for C1 Tocilizumab.    S: Almost 40 minutes into Tocilizumab infusion, he c/o chest heaviness, SOB, sharp back pain (different from his chronic back pain) radiating to his abdomen, and worsening of his chronic headache.  Actemra infusion stopped, given benadryl, pepcid, solu medrol and dilaudid.  O2 at 2 l/nc started.  He later c/o dizziness after the benadryl IVP.  He denies dizziness.    O:      Temp: 36.8 C (98.2 F) (03/11 1130)  Temp src: Temporal (03/11 1130)  Pulse: 89 (03/11 1130)  BP: 166/87 (03/11 1130)  Resp: 16 (03/11 1130)  SpO2: 97 % (03/11 1130)  Height: --  Weight: 126.5 kg (278 lb 14.1 oz) (03/11 0830)  Gen: alert, ox4, NAD.   Pulm: CTA  CV: slightly tachycardic, regular rhythm  Skin: no facial flushing, no rash    A/P: Hypersensitivity Reaction to Tocilizumab  After slight resolution of symptoms, Tocilizumab restarted an hour later at half the rate. He then c/o worsening SOB (SpO2 97% at RA).  Tocilizumab infusion stopped. Monitored patient for half an hour and was discharged home.    Dispo: Discharged to home in stable condition.    Instructed patient to inform the clinic of new issues or for any questions, and that he should go to ED for urgent symptoms.    Patient expressed understanding.    Kateri Mc  Nurse Practitioner II  Adult Infusion Cancer Center  [facility]

## 2019-08-06 NOTE — Progress Notes (Signed)
Pt ambulatory to chair. Pt identified by full name, DOB. And MRN. Allergies reviewed. RN addressed all patient questions and concerns. Plan of care reviewed with pt: New Drug Actemra. Denies any active fever cough and or infection. Complains of 10/10 pain to head and back. Pt explains he has had multiple back surgeries and is currently here for his temporal arteritis. On Norco 10s taking it every 4-6 hours with little relief. Reviewed new drug. Stated the purpose and action of all medications that would be given throughout the course of treatment. Reviewed prescribed treatment schedule and length of treatments. Discussed common side effects and s/s of allergic reaction to medications that should be reported to the RN while receiving infusion and to report to MD when at home. Patient given written literature for review at home in the form of medication-specific handouts.    PIV placed and good blood return obtained to hand. PIV flushes easily. Maintenance fluids infusing.    0921: Actemra started.  1000: Pt complained of new lower back pain, chest pain and worsening HA. Actemra stopped hydration started. NP notified.    1003: BP elevated. Chest pain and lower back pain present. Complains of SOB. Benadryl 50mg  IVP given. NP Zenny at chairside.   1004: Symptoms not resolved. Oxygen placed. 99% on 3L. Pepcid IV given.  1012: Symptoms a "little"better however continues to complain of pressure to chest. IVP solumedrol given.    Plan is to give pt abut 30 minutes to feel better and to restart drug. Pt agreeable to plan. IV dilaudid 1 mg given IVP as pt continued to complain of HA and back pain.     1057: Zenny, NP at chair side assessing pt. VS stable. Pt states feeling "little" better. Back and head pain still present as pt came in with that pain. Pt agreeable to restarting medication. Pt understands to call RN if symptoms worsen.     1104: Pt started to complain of worsening SOB and back and chest pain. Medication  stopped. VS overall stable with elevated BP. Oxygen 100% on 2L NC. Hydration infusing. Informed NP. Plan is to reassess pt in about 30 minutes and to not continue with medication as pt has already received Benadryl, Pepcid, solumedrol.    1145: VSS pt 97% oxygen on RA. , NP assessed pt. Plan is to discharge pt. Pt asking if he may get an additional pain medication as the 1mg  of dialuid help alleviate his head and back pain. Explained to pt we have given pain medication and steroids as those medications should be helpful. Also explained that the Infusion Center is specifically for his Actemra infusion and not necessarily to help with his pain. Explained to pt a message will be sent to Dr Cordella Register to help address his pain and encouraged pt to also send a message to Dr . Pt states understanding.  AVS summary provided. Educated pt if SOB and or chest pain occurs to call 911 and or to head to the nearest ED. Also provided pt Dr Steva Ready office phone number.  Provided pt a handout to help set up MyChart so pt will be able to contact Dr Steva Ready as well. Denied any further questions and or concerns.     PIV removed. Gauze dressing placed and secured with coban.  Pt discharged ambulatory.

## 2019-08-06 NOTE — Telephone Encounter (Signed)
Phone call to patient. Confirmed patients identity using two identifiers. Patient was informed the information below. He stated that his blood sugars have been reading high and it last read 600 when he checked it today. He stated that he has "given himself 30 units of insulin about an hour ago and it has not brought his blood sugars down". He stated that he is feeling shaky and that he will have his wife drive him to the ER. I agreed that if he is not feeling well and his blood sugar is not going down then he should have his wife drive him to the ER. He stated that he would call us back regarding starting the low dose prednisone.   Shawnta Zimbelman, MA I 08/06/19 5:07 PM

## 2019-08-06 NOTE — Patient Instructions (Addendum)
Notify your doctor's office immediately if you have any of the following symptoms by calling [phone]                -Itching, hives              -Fever greater than 100.79F/chills              -Pain uncontrolled by medication              -Inability to take in liquids or adequate nutrition  by mouth              -Diarrhea or constipation not controlled by medication              -Shortness of breath or chest pain dial 911 or go to closet ER

## 2019-08-07 MED FILL — sodium chloride 0.9 % injection solution: INTRAMUSCULAR | Qty: 10 | Status: AC

## 2019-08-13 ENCOUNTER — Other Ambulatory Visit: Payer: Self-pay | Admitting: Family Medicine

## 2019-08-25 ENCOUNTER — Ambulatory Visit: Payer: BLUE CROSS/BLUE SHIELD | Admitting: RHEUMATOLOGY

## 2019-09-02 ENCOUNTER — Telehealth: Payer: Self-pay | Admitting: RHEUMATOLOGY

## 2019-09-02 NOTE — Telephone Encounter (Signed)
Received a message that the MD would like to see pt prior to 04/08 Actemra infusion appt. Reached out to pt and cx'd 04/08 appt per request and advised clinic will be reaching out to get him schedule w/ MD. Pt confirmed.    Thank you,    [facility] Adult Infusion  EXT [phone]

## 2019-09-02 NOTE — Telephone Encounter (Signed)
Received message that patient wanted to cx up coming infusion appointment. Called and left message on machine to call back.

## 2019-09-03 ENCOUNTER — Ambulatory Visit: Admission: RE | Admit: 2019-09-03 | Payer: BLUE CROSS/BLUE SHIELD | Source: Ambulatory Visit

## 2019-09-30 ENCOUNTER — Telehealth: Payer: Self-pay | Admitting: RHEUMATOLOGY

## 2019-09-30 NOTE — Telephone Encounter (Signed)
Left voicemail for patient regarding scheduling an appointment per MD's request. If the patient calls back, please schedule him an appointment.  Taniah Reinecke, MA I 09/30/19 10:11 AM

## 2019-10-01 ENCOUNTER — Ambulatory Visit: Admission: RE | Admit: 2019-10-01 | Payer: BLUE CROSS/BLUE SHIELD | Source: Ambulatory Visit

## 2020-07-31 ENCOUNTER — Emergency Department
Admission: EM | Admit: 2020-07-31 | Discharge: 2020-07-31 | Disposition: A | Discharge status: Home / Self Care | Payer: BLUE CROSS/BLUE SHIELD | Attending: Emergency Medicine | Admitting: Emergency Medicine

## 2020-07-31 ENCOUNTER — Encounter: Payer: Self-pay | Admitting: Emergency Medicine

## 2020-07-31 ENCOUNTER — Emergency Department (EMERGENCY_DEPARTMENT_HOSPITAL): Payer: BLUE CROSS/BLUE SHIELD

## 2020-07-31 DIAGNOSIS — Y9289 Other specified places as the place of occurrence of the external cause: Secondary | ICD-10-CM | POA: Insufficient documentation

## 2020-07-31 DIAGNOSIS — W1789XA Other fall from one level to another, initial encounter: Secondary | ICD-10-CM | POA: Insufficient documentation

## 2020-07-31 DIAGNOSIS — R590 Localized enlarged lymph nodes: Secondary | ICD-10-CM

## 2020-07-31 DIAGNOSIS — M549 Dorsalgia, unspecified: Secondary | ICD-10-CM | POA: Insufficient documentation

## 2020-07-31 DIAGNOSIS — Z79899 Other long term (current) drug therapy: Secondary | ICD-10-CM

## 2020-07-31 DIAGNOSIS — K6389 Other specified diseases of intestine: Secondary | ICD-10-CM

## 2020-07-31 DIAGNOSIS — K8689 Other specified diseases of pancreas: Secondary | ICD-10-CM

## 2020-07-31 DIAGNOSIS — R1012 Left upper quadrant pain: Secondary | ICD-10-CM

## 2020-07-31 DIAGNOSIS — Z794 Long term (current) use of insulin: Secondary | ICD-10-CM

## 2020-07-31 DIAGNOSIS — S301XXA Contusion of abdominal wall, initial encounter: Secondary | ICD-10-CM

## 2020-07-31 DIAGNOSIS — I2510 Atherosclerotic heart disease of native coronary artery without angina pectoris: Secondary | ICD-10-CM | POA: Insufficient documentation

## 2020-07-31 DIAGNOSIS — R7989 Other specified abnormal findings of blood chemistry: Secondary | ICD-10-CM

## 2020-07-31 DIAGNOSIS — E119 Type 2 diabetes mellitus without complications: Secondary | ICD-10-CM | POA: Insufficient documentation

## 2020-07-31 DIAGNOSIS — I509 Heart failure, unspecified: Secondary | ICD-10-CM | POA: Insufficient documentation

## 2020-07-31 DIAGNOSIS — I110 Hypertensive heart disease with heart failure: Secondary | ICD-10-CM | POA: Insufficient documentation

## 2020-07-31 LAB — CBC WITH DIFFERENTIAL
Basophils % Auto: 0.4 %
Basophils Abs Auto: 0 10*3/uL (ref 0.0–0.2)
Eosinophils % Auto: 5.5 %
Eosinophils Abs Auto: 0.7 10*3/uL — ABNORMAL HIGH (ref 0.0–0.5)
Hematocrit: 31.5 % — ABNORMAL LOW (ref 41.0–53.0)
Hemoglobin: 10.8 g/dL — ABNORMAL LOW (ref 13.5–17.5)
Lymphocytes % Auto: 10.2 %
Lymphocytes Abs Auto: 1.3 10*3/uL (ref 1.0–4.8)
MCH: 25.6 pg — ABNORMAL LOW (ref 27.0–33.0)
MCHC: 34.3 % (ref 32.0–36.0)
MCV: 74.5 fL — ABNORMAL LOW (ref 80.0–100.0)
MPV: 7.3 fL (ref 6.8–10.0)
Monocytes % Auto: 5.5 %
Monocytes Abs Auto: 0.7 10*3/uL (ref 0.1–0.8)
Neutrophils % Auto: 78.4 %
Neutrophils Abs Auto: 10.3 10*3/uL — ABNORMAL HIGH (ref 1.8–7.7)
Platelet Count: 320 10*3/uL (ref 130–400)
RDW: 16.4 % — ABNORMAL HIGH (ref 0.0–14.7)
Red Blood Cell Count: 4.23 10*6/uL — ABNORMAL LOW (ref 4.50–5.90)
White Blood Cell Count: 13.2 10*3/uL — ABNORMAL HIGH (ref 4.5–11.0)

## 2020-07-31 LAB — HEPATIC FUNCTION PANEL
Alanine Transferase (ALT): 11 U/L (ref <=41)
Albumin: 3.9 g/dL — ABNORMAL LOW (ref 4.0–4.9)
Alkaline Phosphatase (ALP): 168 U/L — ABNORMAL HIGH (ref 35–129)
Aspartate Transaminase (AST): 14 U/L (ref <=41)
Bilirubin Direct: <0.2 mg/dL (ref 0.0–0.3)
Bilirubin Total: 0.2 mg/dL (ref <=1.2)
Protein: 8.2 g/dL (ref 6.6–8.7)

## 2020-07-31 LAB — BASIC METABOLIC PANEL
Calcium: 9.5 mg/dL (ref 8.6–10.0)
Carbon Dioxide Total: 23 mmol/L (ref 22–29)
Chloride: 98 mmol/L (ref 98–107)
Creatinine Serum: 1.95 mg/dL — ABNORMAL HIGH (ref 0.51–1.17)
E-GFR Creatinine (Male): 36 mL/min/{1.73_m2}
Glucose: 173 mg/dL — ABNORMAL HIGH (ref 74–109)
Potassium: 3.5 mmol/L (ref 3.4–5.1)
Sodium: 135 mmol/L — ABNORMAL LOW (ref 136–145)
Urea Nitrogen, Blood (BUN): 35 mg/dL — ABNORMAL HIGH (ref 6–20)

## 2020-07-31 LAB — LIPASE: Lipase: 5 U/L — ABNORMAL LOW (ref 13–60)

## 2020-07-31 MED ORDER — ACETAMINOPHEN 325 MG TABLET
650.0000 mg | ORAL_TABLET | Freq: Once | ORAL | Status: AC
Start: 2020-07-31 — End: 2020-07-31
  Administered 2020-07-31: 650 mg via ORAL
  Filled 2020-07-31: qty 2

## 2020-07-31 MED ORDER — IOHEXOL 350 MG IODINE/ML INTRAVENOUS SOLUTION
125.0000 mL | INTRAVENOUS | Status: AC
Start: 2020-07-31 — End: 2020-07-31
  Administered 2020-07-31: 125 mL via INTRAVENOUS

## 2020-07-31 MED ORDER — LACTATED RINGERS IV BOLUS - DURATION REQ
1000.0000 mL | Freq: Once | INTRAVENOUS | Status: AC
Start: 2020-07-31 — End: 2020-07-31
  Administered 2020-07-31: 1000 mL via INTRAVENOUS

## 2020-07-31 NOTE — Discharge Instructions (Addendum)
Your creatinine is slightly elevated (kidney function).  This may be due to dehydration or problems with your kidneys.  You need to follow-up with your doctor to have your kidney further checked.     Your alkaline phosphtase is slightly elevated.  Please follow-up with your doctor for further evaluation and re-check.     Your CT shows "Pancreatic ductal dilatation with 1.3 cm calcification within the  pancreatic duct. The distal pancreas appears atrophic with numerous  punctate calcifications." These "findings favored to represent sequela of chronic  pancreatitis however cannot exclude underlying malignancy (cancer).  Thus, radiology recommends that you have a non-emergent evaluation with MRI to make sure that you do not have cancer.  Please talk with your primary care provider.

## 2020-07-31 NOTE — ED Triage Note (Signed)
Constipation x 4 hours....Malik Kitchenfell out of [place]....Malik KitchenMarland [name]

## 2020-07-31 NOTE — ED Nursing Note (Signed)
Pt [place] via EMS [place] gur[facility] [address] Pt c/o constipation x 1 day, with LLQ abd pain. Last BM was yesterday. C/o nausea, no vomiting. Pt is GCS 15, answers questions appropriately.

## 2020-07-31 NOTE — ED Provider Notes (Signed)
EMERGENCY DEPARTMENT PHYSICIAN NOTE - Saquan Furtick       Date of Service:   07/31/2020  5:38 PM Patient's PCP: Arman Bogus   Note Started: 07/31/2020 22:02 DOB: 01-01-1961         Chief Complaint   Patient presents with    Constipation       The history provided by the patient.  Interpreter used: No    Malik [name] is a 60yr old male, who has a past medical history significant for CAD, CHF, back pain/surgery, OUD, presenting to the ED with a chief complaint of abdominal pain that began 4 hours ago. Patient states he was in a port-o-potty and then fell getting out and landed on his legs/abdomen.  Has had abdominal pain since.  No nausea/vomiting.  Did not hit head/neck, chest or other extremities. No vomiting/diarrhea or other complaints.        A full history, including past medical, social, and family history (as detailed in this note), was reviewed and updated as necessary.      HISTORY:  Past Medical History    Anxiety    Diabetes mellitus (HCC)    Heart failure (HCC)    Hypertension    No Known Allergies   History reviewed. No pertinent surgical history.   Current Outpatient Medications:     Atorvastatin (LIPITOR) 40 mg tablet, Take 1 tablet by mouth every day.    Carvedilol (COREG) 25 mg Tablet,      Citalopram (CELEXA) 10 mg tablet,      FUROsemide (LASIX) 20 mg Tablet,      FUROsemide (LASIX) 40 mg Tablet, Take 40 mg by mouth 2 times daily.    Gabapentin (NEURONTIN) 300 mg Capsule, Take 3 capsules by mouth 3 times daily.    Insulin Glargine (LANTUS) 100 unit/mL Vial, Inject 50 Units subcutaneously every day at bedtime.    Insulin Lispro (HUMALOG U-100 INSULIN) 100 unit/mL Vial, Inject 30 Units subcutaneously once daily with a meal. With each meal; check sugars before each dose and skip a dose if level is 110 or less    INSULIN NPH S-S/REG INSULN S-S (INSULIN NPH-REGULAR HUM S-SYN SC),      Potassium Chloride 10 mEq SR Tablet, Take 10 mEq by mouth every day.    Sertraline (ZOLOFT)  100 mg Tablet, Take 1 tablet by mouth every day.      Social History     Social History Narrative    Not on file    No family history on file.        Review of Systems   Gastrointestinal: Positive for abdominal pain.   Musculoskeletal: Positive for back pain.   All other systems reviewed and are negative.         TRIAGE VITAL SIGNS:  Temp: 36.4 C (97.5 F) (07/31/20 1737)  Temp src: Oral (07/31/20 1737)  Pulse: 80 (07/31/20 1737)  BP: 153/88 (07/31/20 1737)  Resp: 18 (07/31/20 1737)  SpO2: 98 % (07/31/20 1737)  Weight: (not recorded)    Physical Exam  HENT:      Head: Atraumatic.   Eyes:      Conjunctiva/sclera: Conjunctivae normal.   Neck:      Comments: No tenderness  Cardiovascular:      Rate and Rhythm: Normal rate and regular rhythm.      Pulses: Normal pulses.   Pulmonary:      Effort: Pulmonary effort is normal.   Chest:      Chest wall: No  tenderness.   Abdominal:      General: Abdomen is flat.      Tenderness: There is abdominal tenderness.      Comments: Mild diffuse tenderness, greatest in LUQ   Musculoskeletal:         General: Normal range of motion.      Cervical back: Normal range of motion.   Skin:     General: Skin is warm.      Capillary Refill: Capillary refill takes less than 2 seconds.   Neurological:      General: No focal deficit present.      Mental Status: He is alert.   Psychiatric:         Mood and Affect: Mood normal.              INITIAL ASSESSMENT & PLAN, MEDICAL DECISION MAKING, ED COURSE  Malik [name] is a 60yr male who presents with a chief complaint of abdominal pain after fall.     Differential includes, but is not limited to: IAI, contusion, other non-traumatic causes unlikely due to temporal association but mechanism is very benign.  Nonethless, will check labs and abdominal CT.  Discussed need for CT with patient and risks.     The results of the ED evaluation were notable for the following:     Pertinent lab results:   Results for orders placed or performed during the  hospital encounter of 07/31/20   CBC with Differential     Status: Abnormal   Result Value Status    White Blood Cell Count 13.2 (H) Final    Red Blood Cell Count 4.23 (L) Final    Hemoglobin 10.8 (L) Final    Hematocrit 31.5 (L) Final    MCV 74.5 (L) Final    MCH 25.6 (L) Final    MCHC 34.3 Final    RDW 16.4 (H) Final    MPV 7.3 Final    Platelet Count 320 Final    Neutrophils % Auto 78.4 Final    Lymphocytes % Auto 10.2 Final    Monocytes % Auto 5.5 Final    Eosinophils % Auto 5.5 Final    Basophils % Auto 0.4 Final    Neutrophils Abs Auto 10.3 (H) Final    Lymphocytes Abs Auto 1.3 Final    Monocytes Abs Auto 0.7 Final    Eosinophils Abs Auto 0.7 (H) Final    Basophils Abs Auto 0.0 Final   Basic Metabolic Panel     Status: Abnormal   Result Value Status    Sodium 135 (L) Final    Potassium 3.5 Final    Chloride 98 Final    Carbon Dioxide Total 23 Final    Urea Nitrogen, Blood (BUN) 35 (H) Final    Creatinine Serum 1.95 (H) Final    Glucose 173 (H) Final    Calcium 9.5 Final    E-GFR (Male) 70 Final   Hepatic Function Panel     Status: Abnormal   Result Value Status    Protein 8.2 Final    Alkaline Phosphatase (ALP) 168 (H) Final    Aspartate Transaminase (AST) 14 Final    Bilirubin Total 0.2 Final    Alanine Transferase (ALT) 11 Final    Bilirubin Direct <0.2 Final    Albumin 3.9 (L) Final   Lipase     Status: Abnormal   Result Value Status    Lipase 5 (L) Final         Pertinent imaging results (reviewed  and interpreted independently by me):   CT Abdomen/Pelvis: no free fluid    Radiology reads:   CT ABDOMEN + PELVIS WITH CONTRAST    Result Date: 07/31/2020  CT ABDOMEN + PELVIS WITH CONTRAST EXAM DATE: 07/31/2020 7:53 PM COMPARISON: CT abdomen/pelvis 05/15/2005 INDICATION: Signs/Symptoms or Diagnosis: fall, abdominal pain Special Instructions: IV Contrast Only - Abdomen Protocol TECHNIQUE: CT of the abdomen and pelvis acquired in the portal venous phase. Uneventful administration of 125 mL of Omnipaque 350 injected  intravenously. No oral contrast was administered. Images were acquired in the axial plane and reformatted in coronal and sagittal planes. DOSE REPORT: This study involved (2) CT acquisition(s).  The CTDIvol and DLP values are included below as required by state law: 1;  Series: 2; Abdomen32 cm;  CTDIvol: 14.9 mGy;  DLP: 805.2 mGy-cm 2;  Series: 2; Abdomen32 cm;  CTDIvol: 13.5 mGy;  DLP: 102.5 mGy-cm For further information on CT radiation dose, see [email or web address] FINDINGS: Lower Chest: Dependent atelectasis. Irregular 3 mm right lower lobe pulmonary nodule (series 2 image 1) favored to represent a subpleural lymph node. Bibasilar mild bronchiectasis. Dense three-vessel coronary artery atherosclerosis/stenting. Mild esophageal reflux Liver: Unremarkable. Bile Ducts: Minimal intrahepatic biliary ductal dilatation which can be normal in a status post cholecystectomy state. Gallbladder: Status post cholecystectomy. Pancreas: 1.3 cm calcification seen within the pancreatic head with distal pancreatic ductal dilatation and atrophy. Numerous punctate pancreatic calcifications likely sequela of chronic pancreatitis. Spleen: Unremarkable. Adrenal Glands: Unremarkable. Kidneys: Multiple hypoattenuating subcentimeter renal lesions too small to accurately characterize. Right upper pole cortical scarring. Multiple nonobstructing left inferior pole renal calculi measuring up to 8 mm. Extra renal left pelvis. GI Tract: Mildly dilated colon filled with stool and air measuring up to 8 cm in the transverse and ascending colon. No appreciable colonic wall thickening or fat stranding. Borderline rectal wall thickening which may be related to underdistention. Normal-appearing appendix. Peritoneal Cavity: No free fluid or free air. Bladder: Unremarkable. Prostate and Seminal Vesicles: A few coarse prostatic calcifications. Lymph Nodes: A few borderline enlarged bilateral external iliac lymph  node chain lymph nodes measuring up to 1.0 cm. For example series 2 image 339 on the left and series 2 and manage 328 on the right. Major Vascular Structures: Atherosclerosis of the aorta and major branch vessels. Circumaortic left renal veins. Soft Tissues: Small fat-containing right inguinal hernia. Musculoskeletal: Bilateral hip osteoarthritis. Multilevel degenerative changes of spine. Flowing anterior disc osteophyte complexes can be seen in setting of diffuse idiopathic skeletal hyperostosis. Posterior spinal fusion hardware extending L4-L5 with intervertebral body disc spacer at L4-L5. Chronic appearing right-sided rib deformities. IMPRESSION: 1. Mild diffuse colonic dilation filled with stool and gas. No significant inflammatory changes appreciated along the colon. There is borderline rectal wall thickening which may be related to underdistention. 2. No acute osseous abnormality or traumatic intra-abdominal abnormality appreciated. 3. Pancreatic ductal dilatation with 1.3 cm calcification within the pancreatic duct. The distal pancreas appears atrophic with numerous punctate calcifications. Findings favored to represent sequela of chronic pancreatitis rather than an IPMN or ductal adenocarcinoma. Recommend nonemergent evaluation with MRI. 4. A few borderline enlarged bilateral external iliac lymph nodes measuring up to 1.0 cm which may be reactive. Incidental finding(s) with recommendation for non-urgent follow-up imaging: YES Preliminary Report Electronically Signed By: Kristine RoyalAmanda Ferguson on 07/31/2020 8:12 PM Rad Peer: # I have personally reviewed the images of this study and agree with the above report. Final Report Electronically Signed By: Derry Loryamit Lamba, M.D. on 07/31/2020 9:38 PM  ED Medication Administration through 07/31/2020 2202     Date/Time Order Dose Route Action    07/31/2020 1926 Lactated Ringers Bolus 1,000 mL 1,000 mL IV New bag/syringe    07/31/2020 2024 Acetaminophen (TYLENOL) Tablet 650 mg  650 mg ORAL Given    07/31/2020 1954 Iohexol (OMNIPAQUE) 350 mg/mL Injection 125 mL 125 mL IV Given          Chart Review: I reviewed the patient's prior medical records. Pertinent information that is relevant to this encounter: visits to [facility] with apparent drug contract          PATIENT SUMMARY: 60 year old male with multiple medical problems with abdominal pain after fall.  Apparent abdominal wall contusion as no evidence of injury on CT.  CT does have findings that need follow-up and patient was informed.  Also informed patient of need to follow-up with PMD for laboratory abnormalities.  GIven IV hydration for possible dehydration and elevated renal function.          LAST VITAL SIGNS:  Temp: 36.6 C (97.9 F) (07/31/20 2153)  Temp src: Oral (07/31/20 2153)  Pulse: 74 (07/31/20 2153)  BP: 128/78 (07/31/20 2153)  Resp: 18 (07/31/20 2153)  SpO2: 98 % (07/31/20 2153)  Weight: (not recorded)      Clinical Impression: Abdominal pain, elevated creatinine     Disposition: Discharge. Follow up with PCP. ED discharge instructions were reviewed and provided.        PATIENT'S GENERAL CONDITION:  Good: Vital signs are stable and within normal limits. Patient is conscious and comfortable. Indicators are excellent.               A medical screening exam was performed.    Electronically signed by: Tawnya Crook, MD, Attending Physician
